# Patient Record
Sex: Male | Born: 1935 | Race: White | Hispanic: No | Marital: Married | Smoking: Never smoker
Health system: Southern US, Community
[De-identification: ages and names within clinical notes are randomized; demographics above are authoritative.]

## PROBLEM LIST (undated history)

## (undated) DIAGNOSIS — N400 Enlarged prostate without lower urinary tract symptoms: Secondary | ICD-10-CM

## (undated) DIAGNOSIS — C801 Malignant (primary) neoplasm, unspecified: Secondary | ICD-10-CM

## (undated) DIAGNOSIS — J449 Chronic obstructive pulmonary disease, unspecified: Secondary | ICD-10-CM

## (undated) HISTORY — PX: APPENDECTOMY: SHX54

## (undated) HISTORY — PX: MASTECTOMY: SHX3

## (undated) HISTORY — PX: HERNIA REPAIR: SHX51

---

## 2016-02-08 ENCOUNTER — Encounter (HOSPITAL_BASED_OUTPATIENT_CLINIC_OR_DEPARTMENT_OTHER): Payer: Self-pay

## 2016-02-08 ENCOUNTER — Emergency Department (HOSPITAL_BASED_OUTPATIENT_CLINIC_OR_DEPARTMENT_OTHER)
Admission: EM | Admit: 2016-02-08 | Discharge: 2016-02-08 | Disposition: A | Discharge status: Home / Self Care | Payer: Medicare Other | Attending: Emergency Medicine | Admitting: Emergency Medicine

## 2016-02-08 ENCOUNTER — Emergency Department (HOSPITAL_BASED_OUTPATIENT_CLINIC_OR_DEPARTMENT_OTHER): Payer: Medicare Other

## 2016-02-08 DIAGNOSIS — Z23 Encounter for immunization: Secondary | ICD-10-CM | POA: Diagnosis not present

## 2016-02-08 DIAGNOSIS — W1809XA Striking against other object with subsequent fall, initial encounter: Secondary | ICD-10-CM | POA: Insufficient documentation

## 2016-02-08 DIAGNOSIS — Y999 Unspecified external cause status: Secondary | ICD-10-CM | POA: Diagnosis not present

## 2016-02-08 DIAGNOSIS — Z79899 Other long term (current) drug therapy: Secondary | ICD-10-CM | POA: Insufficient documentation

## 2016-02-08 DIAGNOSIS — J449 Chronic obstructive pulmonary disease, unspecified: Secondary | ICD-10-CM | POA: Diagnosis not present

## 2016-02-08 DIAGNOSIS — S0993XA Unspecified injury of face, initial encounter: Secondary | ICD-10-CM | POA: Diagnosis not present

## 2016-02-08 DIAGNOSIS — Y929 Unspecified place or not applicable: Secondary | ICD-10-CM | POA: Insufficient documentation

## 2016-02-08 DIAGNOSIS — Y9389 Activity, other specified: Secondary | ICD-10-CM | POA: Diagnosis not present

## 2016-02-08 DIAGNOSIS — Z853 Personal history of malignant neoplasm of breast: Secondary | ICD-10-CM | POA: Insufficient documentation

## 2016-02-08 DIAGNOSIS — Z7982 Long term (current) use of aspirin: Secondary | ICD-10-CM | POA: Diagnosis not present

## 2016-02-08 HISTORY — DX: Benign prostatic hyperplasia without lower urinary tract symptoms: N40.0

## 2016-02-08 HISTORY — DX: Malignant (primary) neoplasm, unspecified: C80.1

## 2016-02-08 HISTORY — DX: Chronic obstructive pulmonary disease, unspecified: J44.9

## 2016-02-08 MED ORDER — TETANUS-DIPHTH-ACELL PERTUSSIS 5-2.5-18.5 LF-MCG/0.5 IM SUSP
0.5000 mL | Freq: Once | INTRAMUSCULAR | Status: AC
  Administered 2016-02-08: 0.5 mL via INTRAMUSCULAR
  Filled 2016-02-08: qty 0.5

## 2016-02-08 NOTE — Discharge Instructions (Signed)
Please read attached information. If you experience any new or worsening signs or symptoms please return to the emergency room for evaluation. Please follow-up with your primary care provider or specialist as discussed.  °

## 2016-02-08 NOTE — ED Provider Notes (Signed)
[facility] [facility] Provider Note   CSN: [id number] Arrival date & time: 02/08/16  1002   History   Chief Complaint Chief Complaint  Patient presents with  . Fall    HPI Patrick Bates is a 82 y.o. male.  HPI   82 year old male presents status post fall. Patient reports he was squatting down last night to change the hose outside, he notes he lost balance and fell forward and struck his face on the concrete. He reports abrasion and swelling to the right orbital surrounding soft tissues. He reports bleeding yesterday as he is on aspirin. Patient notes nor bleeding, reports very minimal pain to the area but significant swelling. He denies any changes, painful ocular movements, rhinorrhea, or significant trauma to the remainder of the face. Patient denies any neck pain, headache, neurological deficits. Not on any anticoagulants.   Past Medical History:  Diagnosis Date  . Cancer ([place])    Breast ([place])  . COPD (chronic obstructive pulmonary disease) ([place])   . Prostate enlargement     There are no active problems to display for this patient.   Past Surgical History:  Procedure Laterality Date  . APPENDECTOMY    . HERNIA REPAIR    . MASTECTOMY Right      Home Medications    Prior to Admission medications   Medication Sig Start Date End Date Taking? Authorizing Provider  alendronate (FOSAMAX) 70 MG tablet Take 70 mg by mouth once a week. Take with a full glass of water on an empty stomach.   Yes Historical Provider, MD  aspirin EC 81 MG tablet Take 81 mg by mouth daily.   Yes Historical Provider, MD  Calcium Carbonate (CALCIUM-CARB 600 PO) Take by mouth.   Yes Historical Provider, MD  donepezil (ARICEPT) 5 MG tablet Take 5 mg by mouth at bedtime.   Yes Historical Provider, MD  finasteride (PROSCAR) 5 MG tablet Take 5 mg by mouth daily.   Yes Historical Provider, MD  mometasone (NASONEX) 50 MCG/ACT nasal spray Place 2 sprays into the nose daily.   Yes Historical Provider, MD    ranitidine (ZANTAC) 150 MG capsule Take 150 mg by mouth 2 (two) times daily.   Yes Historical Provider, MD  simvastatin (ZOCOR) 40 MG tablet Take 40 mg by mouth daily.   Yes Historical Provider, MD  solifenacin (VESICARE) 10 MG tablet Take by mouth daily.   Yes Historical Provider, MD  tamsulosin (FLOMAX) 0.4 MG CAPS capsule Take 0.4 mg by mouth.   Yes Historical Provider, MD  VITAMIN D, CHOLECALCIFEROL, PO Take by mouth.   Yes Historical Provider, MD    Family History History reviewed. No pertinent family history.  Social History Social History  Substance Use Topics  . Smoking status: Never Smoker  . Smokeless tobacco: Never Used  . Alcohol use No     Allergies   Patient has no known allergies.   Review of Systems Review of Systems  All other systems reviewed and are negative.   Physical Exam Updated Vital Signs BP 123/87 (BP Location: Left Arm)   Pulse 80   Temp 97.8 F (36.6 C) (Oral)   Resp 18   Ht 5\' 9"  (1.753 m)   Wt 81.6 kg   SpO2 100%   BMI 26.58 kg/m   Physical Exam  Constitutional: He is oriented to person, place, and time. He appears well-developed and well-nourished.  HENT:  Head: Normocephalic.  Right-sided ocular swelling, bruising, and superficial abrasions. Extraocular movements are intact and  pain-free, nontender nose, no rhinorrhea, jaw full active range of motion, visual acuity and fields intact.  Eyes: Conjunctivae are normal. Pupils are equal, round, and reactive to light. Right eye exhibits no discharge. Left eye exhibits no discharge. No scleral icterus.  Neck: Normal range of motion. No JVD present. No tracheal deviation present.  Pulmonary/Chest: Effort normal. No stridor.  Neurological: He is alert and oriented to person, place, and time. No cranial nerve deficit. Coordination normal.  Psychiatric: He has a normal mood and affect. His behavior is normal. Judgment and thought content normal.  Nursing note and vitals reviewed.   ED  Treatments / Results  Labs (all labs ordered are listed, but only abnormal results are displayed) Labs Reviewed - No data to display  EKG  EKG Interpretation None       Radiology Ct Maxillofacial Wo Contrast  Result Date: 02/08/2016 CLINICAL DATA:  [place] yesterday and hit face on concrete. Right eye swelling and bruising. EXAM: CT MAXILLOFACIAL WITHOUT CONTRAST TECHNIQUE: Multidetector CT imaging of the maxillofacial structures was performed. Multiplanar CT image reconstructions were also generated. A small metallic BB was placed on the right temple in order to reliably differentiate right from left. COMPARISON:  None. FINDINGS: Osseous: No evidence for a facial bone fracture. Mandible is intact. Both mandibular condyles are located. Visualized mastoid air cells are aerated. Zygomatic arches are intact. Pterygoid plates are intact. Prominent degenerative facet disease in the upper cervical spine. No evidence for nasal bone fracture. Orbits: Orbits are intact.  Normal appearance of both globes. Sinuses: Scattered areas of mucosal disease in the left maxillary sinus. There is minimal disease in the left sphenoid sinus. Scattered areas of disease in the ethmoid air cells and frontal sinuses. Soft tissues: Soft tissue swelling in the right cheek and right periorbital region. Soft tissue swelling along the right side of the nose. Limited intracranial: Visualized intracranial structures demonstrate cerebral atrophy. IMPRESSION: No acute facial bone fracture. Soft tissue swelling in the right face and right periorbital region. Electronically Signed   By: Markus Daft M.D.   On: 02/08/2016 11:10    Procedures Procedures (including critical care time)  Medications Ordered in ED Medications  Tdap (BOOSTRIX) injection 0.5 mL (0.5 mLs Intramuscular Given 02/08/16 1052)     Initial Impression / Assessment and Plan / ED Course  I have reviewed the triage vital signs and the nursing notes.  Pertinent  labs & imaging results that were available during my care of the patient were reviewed by me and considered in my medical decision making (see chart for details).  Clinical Course      Final Clinical Impressions(s) / ED Diagnoses   Final diagnoses:  Facial injury, initial encounter    Labs:   Imaging:  Consults:  Therapeutics:  Discharge Meds:   Assessment/Plan: 63 YOM presents today with facial contusion. CT scan shows no bony abnormality, patient has no ocular complaints. Patient will be instructed to use ice, rest, monitor for signs of infection or any concerning signs or symptoms. He is instructed to return to emergency room if any new or worsening signs or symptoms present, follow-up with his primary care if symptoms do not improve. He verbalized understanding and agreement to today's plan had no further questions or concerns.    New Prescriptions New Prescriptions   No medications on file     Okey Regal, PA-C 02/08/16 [place], MD 02/10/16 [phone]

## 2016-02-08 NOTE — ED Triage Notes (Signed)
Pt reports fall yesterday, states he was leaning forward to disconnect a water hose when he tipped forward, hitting his face and his head. Right periorbital area is discolored, red, and edematous. Denies LOC. Reports he takes Aspirin. Alert, oriented x4, appropiate, with no neuro deficits.

## 2017-07-23 IMAGING — CT CT MAXILLOFACIAL W/O CM
3 series · 15 of 47 positions shown, 18 images · non-contrast
Comparison: None.

CLINICAL DATA: Fell yesterday and hit face on concrete. Right eye
swelling and bruising.

EXAM:
CT MAXILLOFACIAL WITHOUT CONTRAST
TECHNIQUE: Multidetector CT imaging of the maxillofacial structures was
performed. Multiplanar CT image reconstructions were also generated.
A small metallic BB was placed on the right temple in order to
reliably differentiate right from left.

[Series 2: max soft · axial · 0.35mm/px · z∈[-170,-2]mm · 9 of 98 slices shown, 12 images]
[im 7/98  brain]
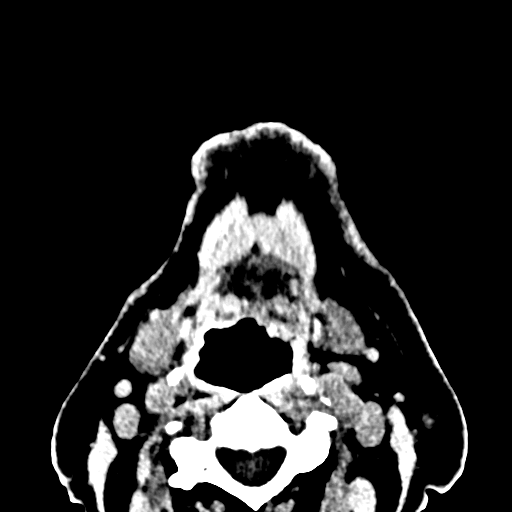
[im 7/98  bone]
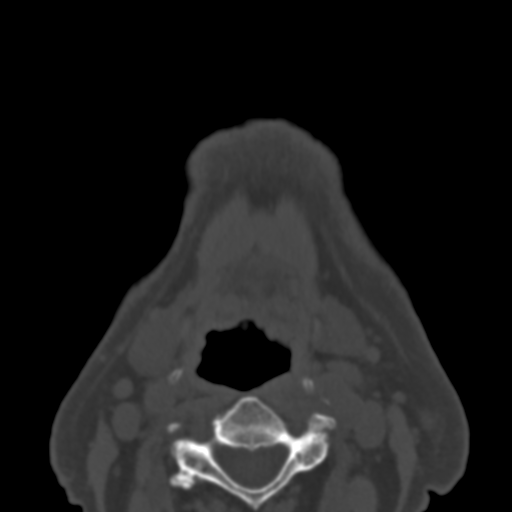
[im 17/98  bone]
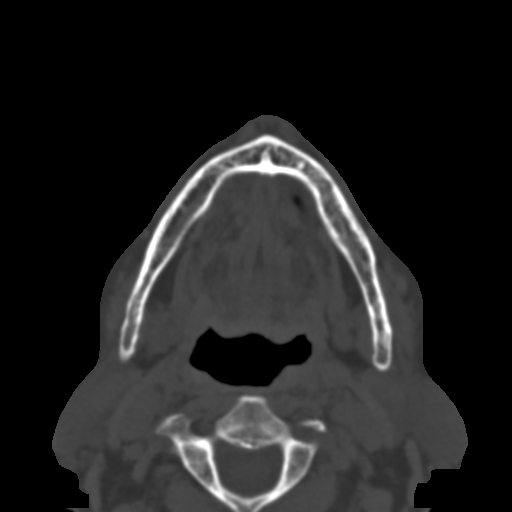
[im 27/98  bone]
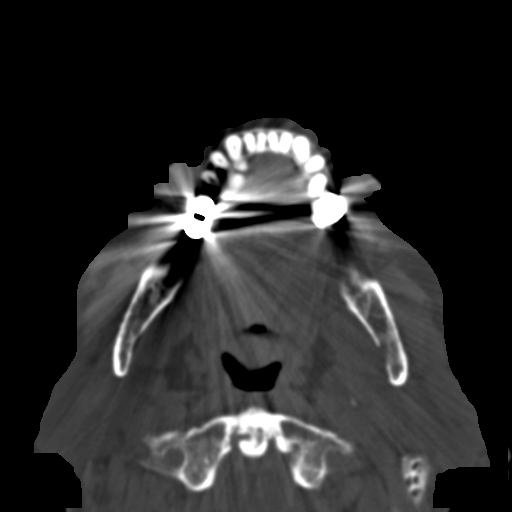
[im 37/98  bone]
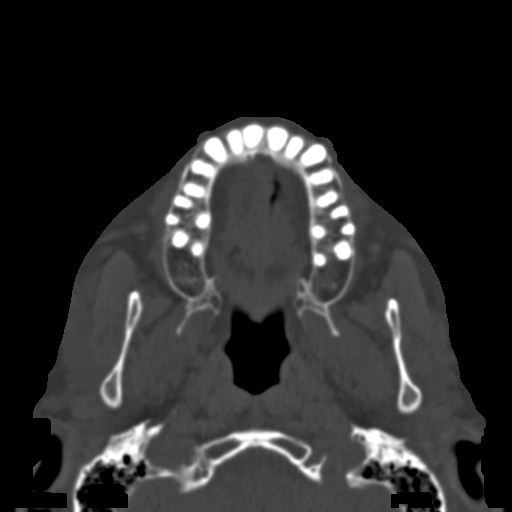
[im 51/98  brain]
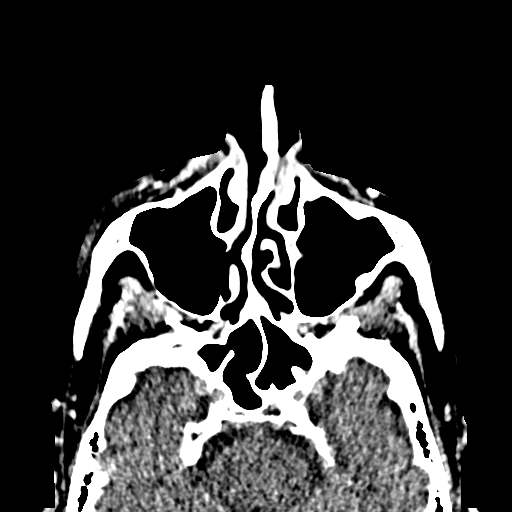
[im 51/98  bone]
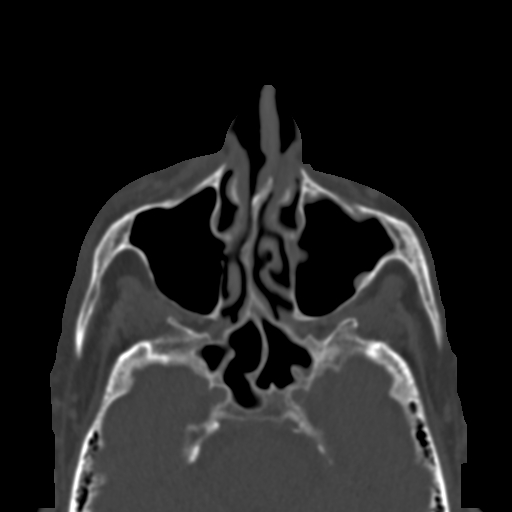
[im 61/98  bone]
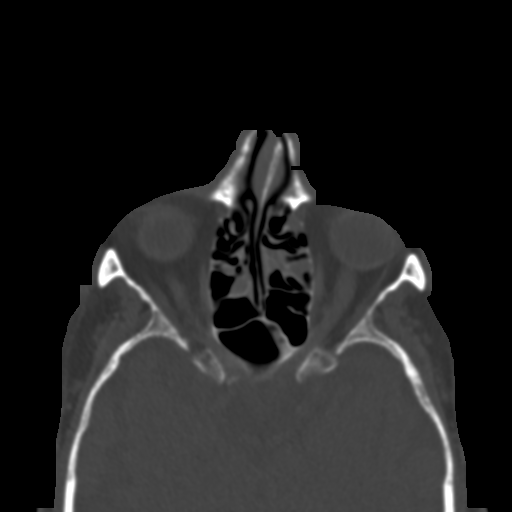
[im 71/98  bone]
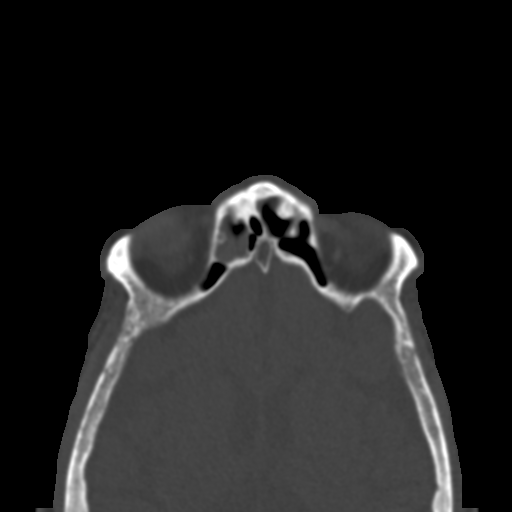
[im 81/98  bone]
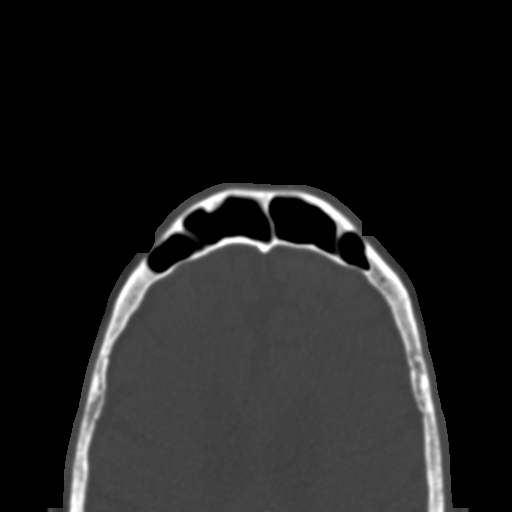
[im 91/98  brain]
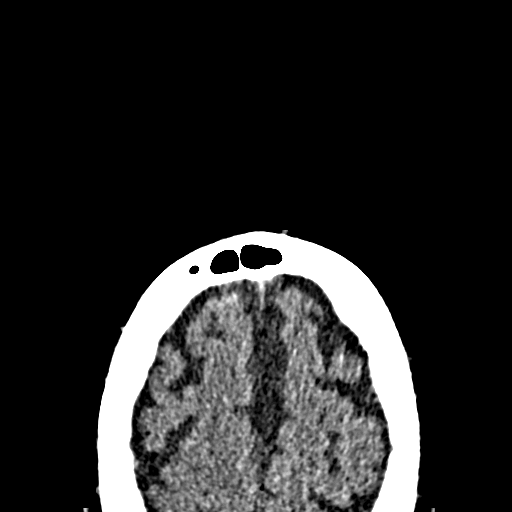
[im 91/98  bone]
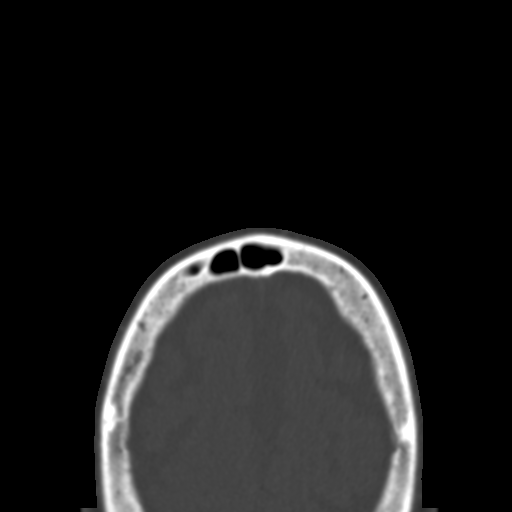

[Series 5: sagittal soft · sagittal · 0.29mm/px · 3 of 86 slices shown]
[im 29/86  bone]
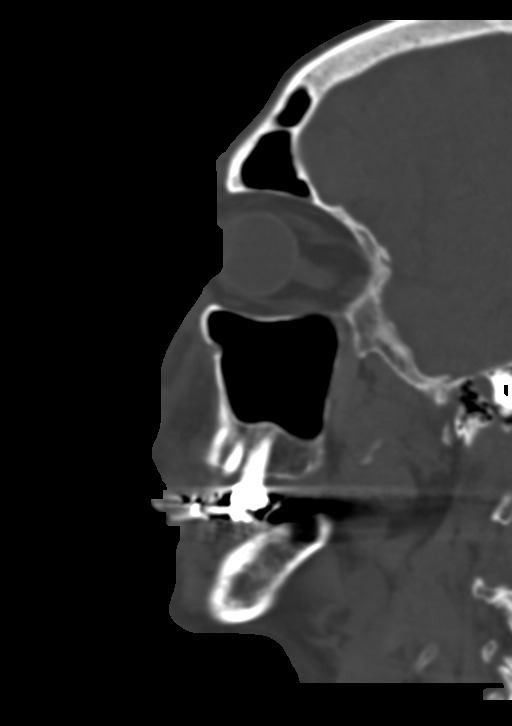
[im 43/86  bone]
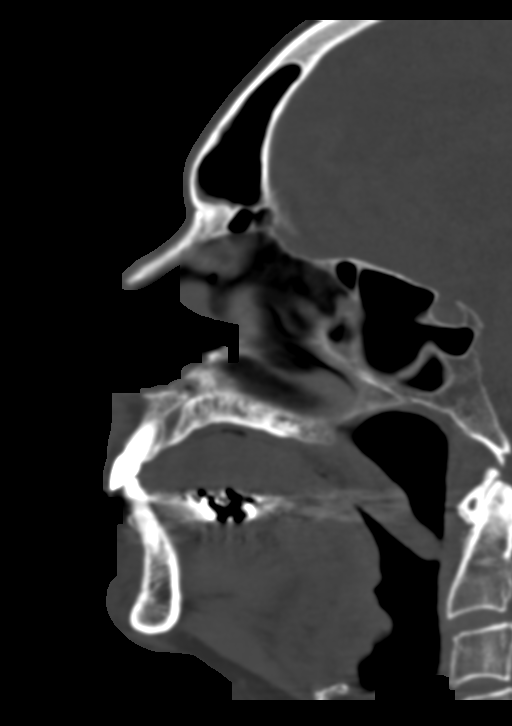
[im 57/86  bone]
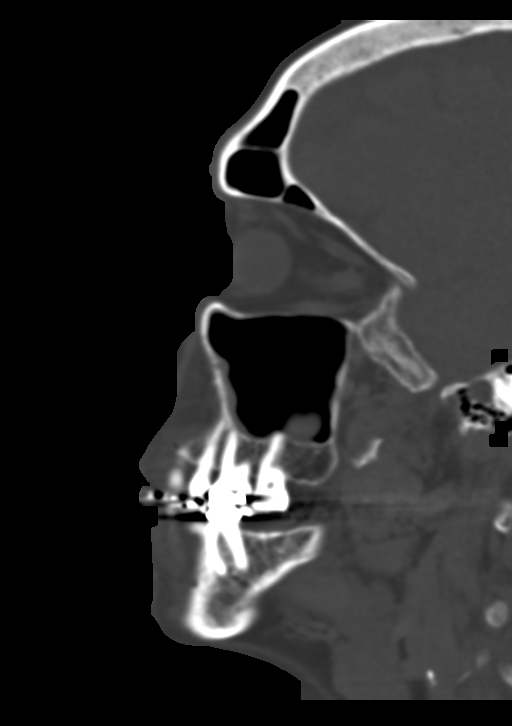

[Series 10: coronal soft · coronal · 0.36mm/px · 3 of 71 slices shown]
[im 24/71  bone]
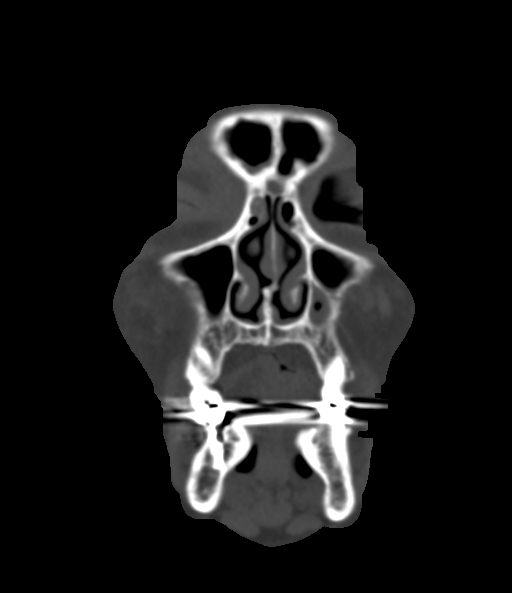
[im 32/71  bone]
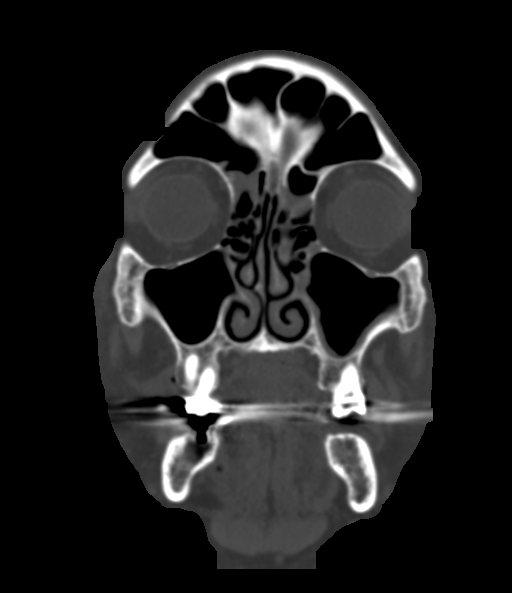
[im 39/71  bone]
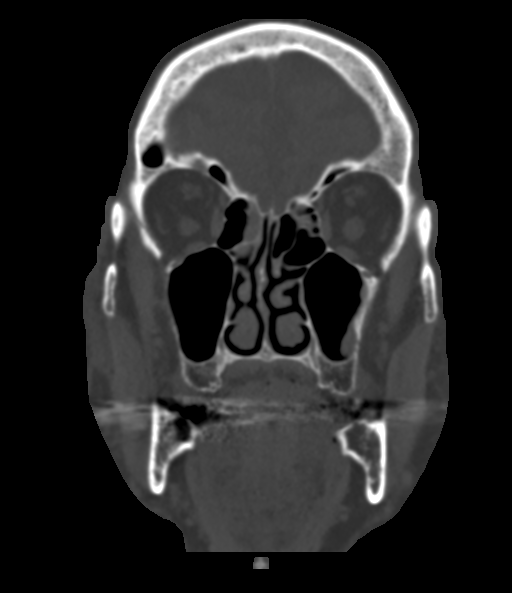

[15 of 47 positions shown; findings below may reference images not displayed]

FINDINGS: Osseous: No evidence for a facial bone fracture. Mandible is intact.
Both mandibular condyles are located. Visualized mastoid air cells
are aerated. Zygomatic arches are intact. Pterygoid plates are
intact. Prominent degenerative facet disease in the upper cervical
spine. No evidence for nasal bone fracture.

Orbits: Orbits are intact.  Normal appearance of both globes.

Sinuses: Scattered areas of mucosal disease in the left maxillary
sinus. There is minimal disease in the left sphenoid sinus.
Scattered areas of disease in the ethmoid air cells and frontal
sinuses.

Soft tissues: Soft tissue swelling in the right cheek and right
periorbital region. Soft tissue swelling along the right side of the
nose.

Limited intracranial: Visualized intracranial structures demonstrate
cerebral atrophy.
IMPRESSION: No acute facial bone fracture.

Soft tissue swelling in the right face and right periorbital region.
